# Patient Record
Sex: Male | Born: 1975 | Hispanic: Yes | Marital: Single | Smoking: Never smoker
Health system: Southern US, Community
[De-identification: ages and names within clinical notes are randomized; demographics above are authoritative.]

---

## 2014-10-07 ENCOUNTER — Observation Stay (HOSPITAL_COMMUNITY)
Admission: EM | Admit: 2014-10-07 | Discharge: 2014-10-08 | Disposition: A | Discharge status: Home / Self Care | Payer: No Typology Code available for payment source | Attending: Internal Medicine | Admitting: Internal Medicine

## 2014-10-07 ENCOUNTER — Emergency Department (HOSPITAL_COMMUNITY): Payer: Self-pay

## 2014-10-07 ENCOUNTER — Emergency Department (HOSPITAL_COMMUNITY): Payer: No Typology Code available for payment source

## 2014-10-07 ENCOUNTER — Encounter (HOSPITAL_COMMUNITY): Payer: Self-pay | Admitting: Family Medicine

## 2014-10-07 DIAGNOSIS — E876 Hypokalemia: Secondary | ICD-10-CM | POA: Insufficient documentation

## 2014-10-07 DIAGNOSIS — R7989 Other specified abnormal findings of blood chemistry: Secondary | ICD-10-CM

## 2014-10-07 DIAGNOSIS — Y998 Other external cause status: Secondary | ICD-10-CM | POA: Insufficient documentation

## 2014-10-07 DIAGNOSIS — R079 Chest pain, unspecified: Secondary | ICD-10-CM | POA: Diagnosis present

## 2014-10-07 DIAGNOSIS — R0789 Other chest pain: Principal | ICD-10-CM | POA: Insufficient documentation

## 2014-10-07 DIAGNOSIS — Y92488 Other paved roadways as the place of occurrence of the external cause: Secondary | ICD-10-CM | POA: Insufficient documentation

## 2014-10-07 DIAGNOSIS — S2232XA Fracture of one rib, left side, initial encounter for closed fracture: Secondary | ICD-10-CM | POA: Insufficient documentation

## 2014-10-07 DIAGNOSIS — M6282 Rhabdomyolysis: Secondary | ICD-10-CM | POA: Insufficient documentation

## 2014-10-07 DIAGNOSIS — Y9389 Activity, other specified: Secondary | ICD-10-CM | POA: Insufficient documentation

## 2014-10-07 DIAGNOSIS — S2691XA Contusion of heart, unspecified with or without hemopericardium, initial encounter: Secondary | ICD-10-CM

## 2014-10-07 DIAGNOSIS — R000 Tachycardia, unspecified: Secondary | ICD-10-CM | POA: Insufficient documentation

## 2014-10-07 DIAGNOSIS — D72829 Elevated white blood cell count, unspecified: Secondary | ICD-10-CM | POA: Insufficient documentation

## 2014-10-07 DIAGNOSIS — M542 Cervicalgia: Secondary | ICD-10-CM | POA: Insufficient documentation

## 2014-10-07 DIAGNOSIS — M549 Dorsalgia, unspecified: Secondary | ICD-10-CM

## 2014-10-07 DIAGNOSIS — I4510 Unspecified right bundle-branch block: Secondary | ICD-10-CM | POA: Insufficient documentation

## 2014-10-07 LAB — CBC WITH DIFFERENTIAL/PLATELET
BASOS PCT: 0 % (ref 0–1)
Basophils Absolute: 0 10*3/uL (ref 0.0–0.1)
EOS ABS: 0.1 10*3/uL (ref 0.0–0.7)
Eosinophils Relative: 1 % (ref 0–5)
HCT: 46.6 % (ref 39.0–52.0)
Hemoglobin: 16.2 g/dL (ref 13.0–17.0)
Lymphocytes Relative: 20 % (ref 12–46)
Lymphs Abs: 3.3 10*3/uL (ref 0.7–4.0)
MCH: 28.5 pg (ref 26.0–34.0)
MCHC: 34.8 g/dL (ref 30.0–36.0)
MCV: 82 fL (ref 78.0–100.0)
Monocytes Absolute: 0.8 10*3/uL (ref 0.1–1.0)
Monocytes Relative: 5 % (ref 3–12)
NEUTROS PCT: 74 % (ref 43–77)
Neutro Abs: 12.3 10*3/uL — ABNORMAL HIGH (ref 1.7–7.7)
PLATELETS: 316 10*3/uL (ref 150–400)
RBC: 5.68 MIL/uL (ref 4.22–5.81)
RDW: 13 % (ref 11.5–15.5)
WBC: 16.6 10*3/uL — ABNORMAL HIGH (ref 4.0–10.5)

## 2014-10-07 LAB — URINALYSIS, ROUTINE W REFLEX MICROSCOPIC
BILIRUBIN URINE: NEGATIVE
Glucose, UA: NEGATIVE mg/dL
KETONES UR: NEGATIVE mg/dL
Leukocytes, UA: NEGATIVE
NITRITE: NEGATIVE
PH: 5.5 (ref 5.0–8.0)
Protein, ur: 30 mg/dL — AB
Specific Gravity, Urine: 1.025 (ref 1.005–1.030)
Urobilinogen, UA: 0.2 mg/dL (ref 0.0–1.0)

## 2014-10-07 LAB — COMPREHENSIVE METABOLIC PANEL
ALK PHOS: 84 U/L (ref 39–117)
ALT: 37 U/L (ref 0–53)
AST: 52 U/L — AB (ref 0–37)
Albumin: 4.6 g/dL (ref 3.5–5.2)
Anion gap: 9 (ref 5–15)
BILIRUBIN TOTAL: 0.8 mg/dL (ref 0.3–1.2)
BUN: 18 mg/dL (ref 6–23)
CHLORIDE: 104 meq/L (ref 96–112)
CO2: 25 mmol/L (ref 19–32)
Calcium: 9.2 mg/dL (ref 8.4–10.5)
Creatinine, Ser: 0.85 mg/dL (ref 0.50–1.35)
GLUCOSE: 137 mg/dL — AB (ref 70–99)
POTASSIUM: 3.2 mmol/L — AB (ref 3.5–5.1)
SODIUM: 138 mmol/L (ref 135–145)
Total Protein: 7.7 g/dL (ref 6.0–8.3)

## 2014-10-07 LAB — URINE MICROSCOPIC-ADD ON

## 2014-10-07 LAB — CK TOTAL AND CKMB (NOT AT ARMC)
CK TOTAL: 2047 U/L — AB (ref 7–232)
CK, MB: 30.3 ng/mL — AB (ref 0.3–4.0)
RELATIVE INDEX: 1.5 (ref 0.0–2.5)

## 2014-10-07 LAB — RAPID URINE DRUG SCREEN, HOSP PERFORMED
Amphetamines: NOT DETECTED
Barbiturates: NOT DETECTED
Benzodiazepines: NOT DETECTED
Cocaine: NOT DETECTED
Opiates: NOT DETECTED
Tetrahydrocannabinol: NOT DETECTED

## 2014-10-07 LAB — TROPONIN I
TROPONIN I: 0.05 ng/mL — AB (ref <0.031)
TROPONIN I: 0.03 ng/mL (ref <0.031)
Troponin I: 0.04 ng/mL — ABNORMAL HIGH (ref <0.031)

## 2014-10-07 MED ORDER — ONDANSETRON HCL 4 MG/2ML IJ SOLN: 4.0000 mg | Freq: Four times a day (QID) | INTRAMUSCULAR | Status: DC | PRN

## 2014-10-07 MED ORDER — IOHEXOL 300 MG/ML  SOLN
80.0000 mL | Freq: Once | INTRAMUSCULAR | Status: AC | PRN
  Administered 2014-10-07: 80 mL via INTRAVENOUS

## 2014-10-07 MED ORDER — HYDROMORPHONE HCL 1 MG/ML IJ SOLN
0.5000 mg | Freq: Once | INTRAMUSCULAR | Status: AC
  Administered 2014-10-07: 0.5 mg via INTRAVENOUS
  Filled 2014-10-07: qty 1

## 2014-10-07 MED ORDER — ACETAMINOPHEN 325 MG PO TABS
650.0000 mg | ORAL_TABLET | Freq: Once | ORAL | Status: AC
  Administered 2014-10-07: 650 mg via ORAL
  Filled 2014-10-07: qty 2

## 2014-10-07 MED ORDER — SODIUM CHLORIDE 0.9 % IV BOLUS (SEPSIS)
1000.0000 mL | Freq: Once | INTRAVENOUS | Status: AC
  Administered 2014-10-07: 1000 mL via INTRAVENOUS

## 2014-10-07 MED ORDER — ENOXAPARIN SODIUM 40 MG/0.4ML ~~LOC~~ SOLN
40.0000 mg | Freq: Every day | SUBCUTANEOUS | Status: DC
  Administered 2014-10-07 – 2014-10-08 (×2): 40 mg via SUBCUTANEOUS
  Filled 2014-10-07 (×2): qty 0.4

## 2014-10-07 MED ORDER — MORPHINE SULFATE 2 MG/ML IJ SOLN
2.0000 mg | INTRAMUSCULAR | Status: DC | PRN
  Filled 2014-10-07: qty 1

## 2014-10-07 MED ORDER — ACETAMINOPHEN 325 MG PO TABS
650.0000 mg | ORAL_TABLET | ORAL | Status: DC | PRN
  Administered 2014-10-07 – 2014-10-08 (×3): 650 mg via ORAL
  Filled 2014-10-07 (×3): qty 2

## 2014-10-07 MED ORDER — SODIUM CHLORIDE 0.9 % IV SOLN
INTRAVENOUS | Status: DC
  Administered 2014-10-07 – 2014-10-08 (×2): via INTRAVENOUS
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
## 2014-10-08 DIAGNOSIS — E876 Hypokalemia: Secondary | ICD-10-CM | POA: Insufficient documentation

## 2014-10-08 DIAGNOSIS — M6282 Rhabdomyolysis: Secondary | ICD-10-CM | POA: Insufficient documentation

## 2014-10-08 DIAGNOSIS — R079 Chest pain, unspecified: Secondary | ICD-10-CM | POA: Insufficient documentation

## 2014-10-08 DIAGNOSIS — D72829 Elevated white blood cell count, unspecified: Secondary | ICD-10-CM | POA: Insufficient documentation

## 2014-10-08 DIAGNOSIS — T796XXD Traumatic ischemia of muscle, subsequent encounter: Secondary | ICD-10-CM

## 2014-10-08 DIAGNOSIS — R072 Precordial pain: Secondary | ICD-10-CM

## 2014-10-08 DIAGNOSIS — R0789 Other chest pain: Secondary | ICD-10-CM

## 2014-10-08 LAB — COMPREHENSIVE METABOLIC PANEL
ALBUMIN: 3.9 g/dL (ref 3.5–5.2)
ALT: 33 U/L (ref 0–53)
AST: 47 U/L — ABNORMAL HIGH (ref 0–37)
Alkaline Phosphatase: 62 U/L (ref 39–117)
Anion gap: 6 (ref 5–15)
BILIRUBIN TOTAL: 1.2 mg/dL (ref 0.3–1.2)
BUN: 13 mg/dL (ref 6–23)
CO2: 26 mmol/L (ref 19–32)
CREATININE: 0.8 mg/dL (ref 0.50–1.35)
Calcium: 8.9 mg/dL (ref 8.4–10.5)
Chloride: 106 mEq/L (ref 96–112)
GFR calc Af Amer: >90 mL/min (ref >90)
GFR calc non Af Amer: >90 mL/min (ref >90)
Glucose, Bld: 100 mg/dL — ABNORMAL HIGH (ref 70–99)
Potassium: 3.8 mmol/L (ref 3.5–5.1)
Sodium: 138 mmol/L (ref 135–145)
Total Protein: 6.9 g/dL (ref 6.0–8.3)

## 2014-10-08 LAB — CBC
HCT: 45.7 % (ref 39.0–52.0)
Hemoglobin: 14.8 g/dL (ref 13.0–17.0)
MCH: 27.2 pg (ref 26.0–34.0)
MCHC: 32.4 g/dL (ref 30.0–36.0)
MCV: 84 fL (ref 78.0–100.0)
PLATELETS: 324 10*3/uL (ref 150–400)
RBC: 5.44 MIL/uL (ref 4.22–5.81)
RDW: 13.5 % (ref 11.5–15.5)
WBC: 10.2 10*3/uL (ref 4.0–10.5)

## 2014-10-08 LAB — CK TOTAL AND CKMB (NOT AT ARMC)
CK TOTAL: 1542 U/L — AB (ref 7–232)
CK, MB: 11.9 ng/mL — AB (ref 0.3–4.0)
Relative Index: 0.8 (ref 0.0–2.5)

## 2014-10-08 MED ORDER — OXYCODONE HCL 5 MG PO TABS: 5.0000 mg | ORAL_TABLET | Freq: Four times a day (QID) | ORAL | Status: AC | PRN

## 2014-10-08 MED ORDER — METHOCARBAMOL 500 MG PO TABS: 500.0000 mg | ORAL_TABLET | Freq: Two times a day (BID) | ORAL | Status: AC | PRN

## 2014-10-08 MED ORDER — ACETAMINOPHEN 500 MG PO TABS: 500.0000 mg | ORAL_TABLET | ORAL | Status: AC | PRN
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
## 2014-10-08 NOTE — Progress Notes (Signed)
SUBJECTIVE:  Mild pain with movement mostly of his neck.  Minimal chest pain.  No pleuritic pain.  No SOB   PHYSICAL EXAM Filed Vitals:   10/07/14 1348 10/07/14 2032 10/08/14 0031 10/08/14 0427  BP: 107/69 121/79 113/69 115/71  Pulse: 127 104 87 97  Temp: 100 F (37.8 C) 98.9 F (37.2 C) 97.8 F (36.6 C) 98 F (36.7 C)  TempSrc: Oral Oral Oral Oral  Resp: 20 18 18 18   Height: 5\' 9"  (1.753 m)     Weight: 179 lb 0.2 oz (81.2 kg)     SpO2: 98% 98% 99% 97%   General:  No distress Lungs:  Clear Heart:  No rubs, no murmurs Abdomen:  Positive bowel sounds, no rebound no guarding Extremities:  No edema   LABS: Lab Results  Component Value Date   TROPONINI 0.03 10/07/2014   Results for orders placed or performed during the hospital encounter of 10/07/14 (from the past 24 hour(s))  Comprehensive metabolic panel     Status: Abnormal   Collection Time: 10/07/14  7:43 AM  Result Value Ref Range   Sodium 138 135 - 145 mmol/L   Potassium 3.2 (L) 3.5 - 5.1 mmol/L   Chloride 104 96 - 112 mEq/L   CO2 25 19 - 32 mmol/L   Glucose, Bld 137 (H) 70 - 99 mg/dL   BUN 18 6 - 23 mg/dL   Creatinine, Ser 1.61 0.50 - 1.35 mg/dL   Calcium 9.2 8.4 - 09.6 mg/dL   Total Protein 7.7 6.0 - 8.3 g/dL   Albumin 4.6 3.5 - 5.2 g/dL   AST 52 (H) 0 - 37 U/L   ALT 37 0 - 53 U/L   Alkaline Phosphatase 84 39 - 117 U/L   Total Bilirubin 0.8 0.3 - 1.2 mg/dL   GFR calc non Af Amer >90 >90 mL/min   GFR calc Af Amer >90 >90 mL/min   Anion gap 9 5 - 15  CBC with Differential     Status: Abnormal   Collection Time: 10/07/14  7:43 AM  Result Value Ref Range   WBC 16.6 (H) 4.0 - 10.5 K/uL   RBC 5.68 4.22 - 5.81 MIL/uL   Hemoglobin 16.2 13.0 - 17.0 g/dL   HCT 04.5 40.9 - 81.1 %   MCV 82.0 78.0 - 100.0 fL   MCH 28.5 26.0 - 34.0 pg   MCHC 34.8 30.0 - 36.0 g/dL   RDW 91.4 78.2 - 95.6 %   Platelets 316 150 - 400 K/uL   Neutrophils Relative % 74 43 - 77 %   Neutro Abs 12.3 (H) 1.7 - 7.7 K/uL   Lymphocytes  Relative 20 12 - 46 %   Lymphs Abs 3.3 0.7 - 4.0 K/uL   Monocytes Relative 5 3 - 12 %   Monocytes Absolute 0.8 0.1 - 1.0 K/uL   Eosinophils Relative 1 0 - 5 %   Eosinophils Absolute 0.1 0.0 - 0.7 K/uL   Basophils Relative 0 0 - 1 %   Basophils Absolute 0.0 0.0 - 0.1 K/uL  Troponin I     Status: None   Collection Time: 10/07/14  7:43 AM  Result Value Ref Range   Troponin I <0.03 <0.031 ng/mL  Urinalysis, Routine w reflex microscopic     Status: Abnormal   Collection Time: 10/07/14  8:02 AM  Result Value Ref Range   Color, Urine YELLOW YELLOW   APPearance CLOUDY (A) CLEAR   Specific Gravity, Urine 1.025 1.005 -  1.030   pH 5.5 5.0 - 8.0   Glucose, UA NEGATIVE NEGATIVE mg/dL   Hgb urine dipstick SMALL (A) NEGATIVE   Bilirubin Urine NEGATIVE NEGATIVE   Ketones, ur NEGATIVE NEGATIVE mg/dL   Protein, ur 30 (A) NEGATIVE mg/dL   Urobilinogen, UA 0.2 0.0 - 1.0 mg/dL   Nitrite NEGATIVE NEGATIVE   Leukocytes, UA NEGATIVE NEGATIVE  Urine microscopic-add on     Status: Abnormal   Collection Time: 10/07/14  8:02 AM  Result Value Ref Range   Squamous Epithelial / LPF RARE RARE   WBC, UA 0-2 <3 WBC/hpf   RBC / HPF 0-2 <3 RBC/hpf   Bacteria, UA FEW (A) RARE   Casts GRANULAR CAST (A) NEGATIVE  Drug screen panel, emergency     Status: None   Collection Time: 10/07/14  8:02 AM  Result Value Ref Range   Opiates NONE DETECTED NONE DETECTED   Cocaine NONE DETECTED NONE DETECTED   Benzodiazepines NONE DETECTED NONE DETECTED   Amphetamines NONE DETECTED NONE DETECTED   Tetrahydrocannabinol NONE DETECTED NONE DETECTED   Barbiturates NONE DETECTED NONE DETECTED  Troponin I     Status: Abnormal   Collection Time: 10/07/14 11:17 AM  Result Value Ref Range   Troponin I 0.05 (H) <0.031 ng/mL  Troponin I-serum (0, 3, 6 hours)     Status: Abnormal   Collection Time: 10/07/14  4:49 PM  Result Value Ref Range   Troponin I 0.04 (H) <0.031 ng/mL   CK total and CKMB (cardiac)     Status: Abnormal    Collection Time: 10/07/14  4:49 PM  Result Value Ref Range   Total CK 2047 (H) 7 - 232 U/L   CK, MB 30.3 (HH) 0.3 - 4.0 ng/mL   Relative Index 1.5 0.0 - 2.5  Troponin I-serum (0, 3, 6 hours)     Status: None   Collection Time: 10/07/14 11:06 PM  Result Value Ref Range   Troponin I 0.03 <0.031 ng/mL  CBC     Status: None   Collection Time: 10/08/14  5:03 AM  Result Value Ref Range   WBC 10.2 4.0 - 10.5 K/uL   RBC 5.44 4.22 - 5.81 MIL/uL   Hemoglobin 14.8 13.0 - 17.0 g/dL   HCT 16.145.7 09.639.0 - 04.552.0 %   MCV 84.0 78.0 - 100.0 fL   MCH 27.2 26.0 - 34.0 pg   MCHC 32.4 30.0 - 36.0 g/dL   RDW 40.913.5 81.111.5 - 91.415.5 %   Platelets 324 150 - 400 K/uL  Comprehensive metabolic panel     Status: Abnormal   Collection Time: 10/08/14  5:03 AM  Result Value Ref Range   Sodium 138 135 - 145 mmol/L   Potassium 3.8 3.5 - 5.1 mmol/L   Chloride 106 96 - 112 mEq/L   CO2 26 19 - 32 mmol/L   Glucose, Bld 100 (H) 70 - 99 mg/dL   BUN 13 6 - 23 mg/dL   Creatinine, Ser 7.820.80 0.50 - 1.35 mg/dL   Calcium 8.9 8.4 - 95.610.5 mg/dL   Total Protein 6.9 6.0 - 8.3 g/dL   Albumin 3.9 3.5 - 5.2 g/dL   AST 47 (H) 0 - 37 U/L   ALT 33 0 - 53 U/L   Alkaline Phosphatase 62 39 - 117 U/L   Total Bilirubin 1.2 0.3 - 1.2 mg/dL   GFR calc non Af Amer >90 >90 mL/min   GFR calc Af Amer >90 >90 mL/min   Anion gap 6 5 -  15    Intake/Output Summary (Last 24 hours) at 10/08/14 0737 Last data filed at 10/08/14 0700  Gross per 24 hour  Intake   1680 ml  Output      0 ml  Net   1680 ml    ASSESSMENT AND PLAN:   CHEST PAIN:  Related to trauma.  Troponin elevation is minimal and not clinically relevant.  It does not diagnose cardiac contusion.  Elevated CK is from muscle trauma.  He does have an incidental finding of a RBBB.  An echo was ordered but not done yet.  He does not need to stay in the hospital for an echo.  Heart rate is now down on telemetry.     Rollene RotundaJames Demichael Traum 10/08/2014 7:37 AM

## 2014-10-08 NOTE — Progress Notes (Signed)
  [name] has been performed.  [name], Fernando Molina 10/08/2014, 1:33 PM
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
